# Patient Record
Sex: Male | Born: 1970 | Race: White | Hispanic: No | Marital: Married | Smoking: Former smoker
Health system: Southern US, Community
[De-identification: ages and names within clinical notes are randomized; demographics above are authoritative.]

## PROBLEM LIST (undated history)

## (undated) DIAGNOSIS — I10 Essential (primary) hypertension: Secondary | ICD-10-CM

## (undated) DIAGNOSIS — E669 Obesity, unspecified: Secondary | ICD-10-CM

## (undated) DIAGNOSIS — F909 Attention-deficit hyperactivity disorder, unspecified type: Secondary | ICD-10-CM

## (undated) DIAGNOSIS — E079 Disorder of thyroid, unspecified: Secondary | ICD-10-CM

## (undated) DIAGNOSIS — E785 Hyperlipidemia, unspecified: Secondary | ICD-10-CM

## (undated) HISTORY — DX: Hyperlipidemia, unspecified: E78.5

## (undated) HISTORY — DX: Attention-deficit hyperactivity disorder, unspecified type: F90.9

## (undated) HISTORY — DX: Obesity, unspecified: E66.9

## (undated) HISTORY — DX: Disorder of thyroid, unspecified: E07.9

## (undated) HISTORY — DX: Essential (primary) hypertension: I10

## (undated) HISTORY — PX: ADENOIDECTOMY: SUR15

---

## 2005-09-13 ENCOUNTER — Encounter: Admission: RE | Admit: 2005-09-13 | Discharge: 2005-09-13 | Payer: Self-pay | Admitting: Emergency Medicine

## 2006-01-01 ENCOUNTER — Emergency Department (HOSPITAL_COMMUNITY): Admission: EM | Admit: 2006-01-01 | Discharge: 2006-01-02 | Payer: Self-pay | Admitting: *Deleted

## 2007-03-01 IMAGING — CT CT HEAD W/O CM
1 series · 16 of 30 positions shown, 20 images · IV contrast (agent unspecified)
Comparison: none

CLINICAL DATA: Severe headache.  
 HEAD CT WITHOUT CONTRAST:
TECHNIQUE: Contiguous axial images were obtained from the base of the skull through the vertex according to standard protocol without contrast.

[Series 2: brain · axial · 0.47mm/px · z∈[+120,+262]mm · 16 of 32 slices shown, 20 images]
[im 2/32  brain]
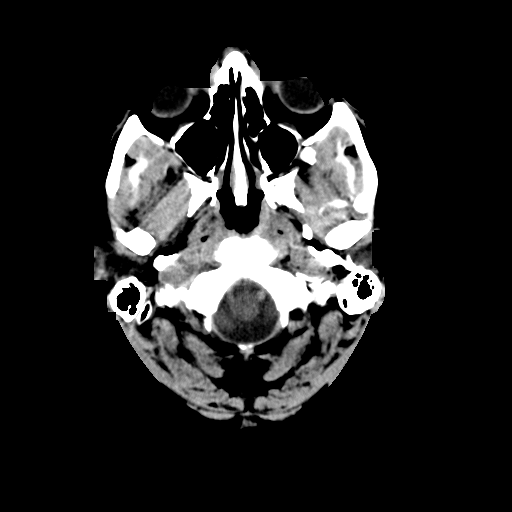
[im 2/32  bone]
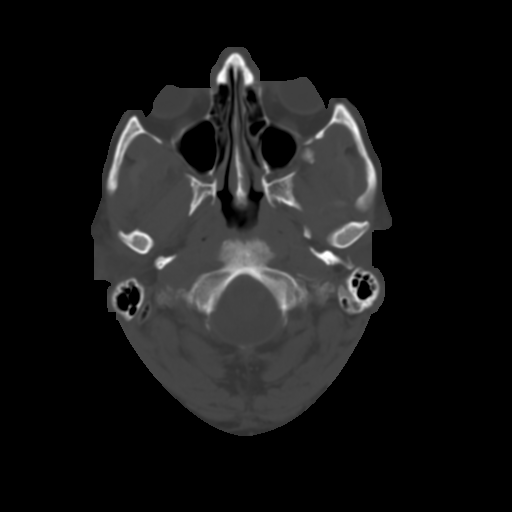
[im 4/32  brain]
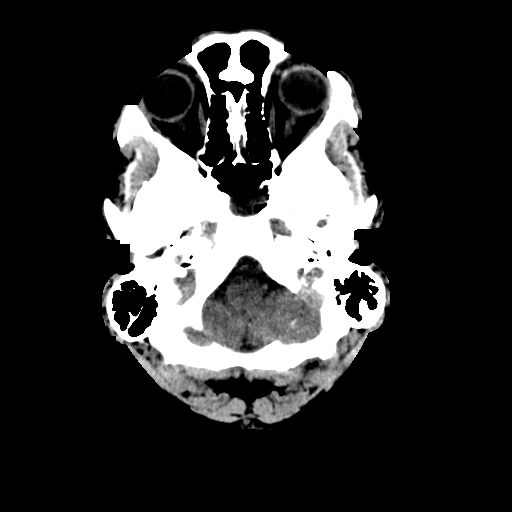
[im 6/32  brain]
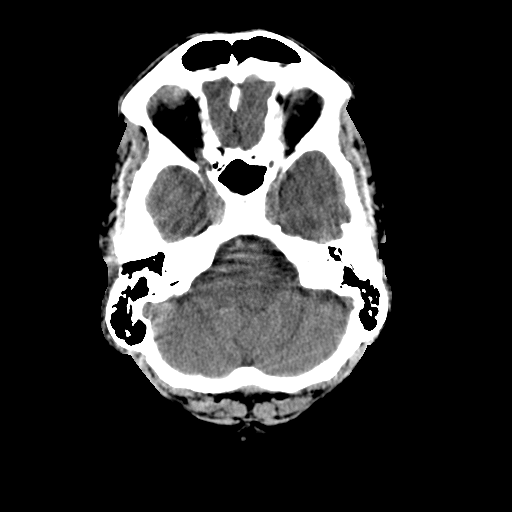
[im 8/32  brain]
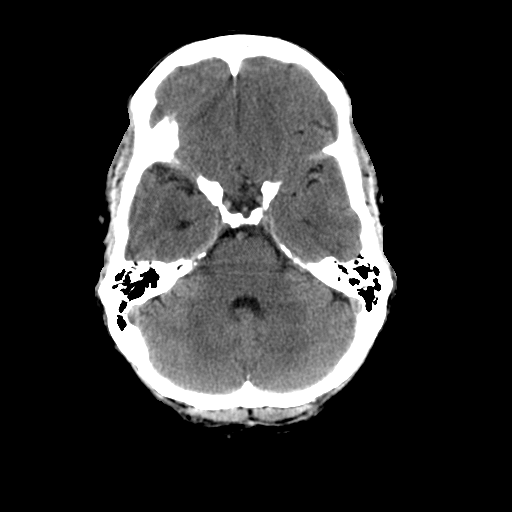
[im 9/32  brain]
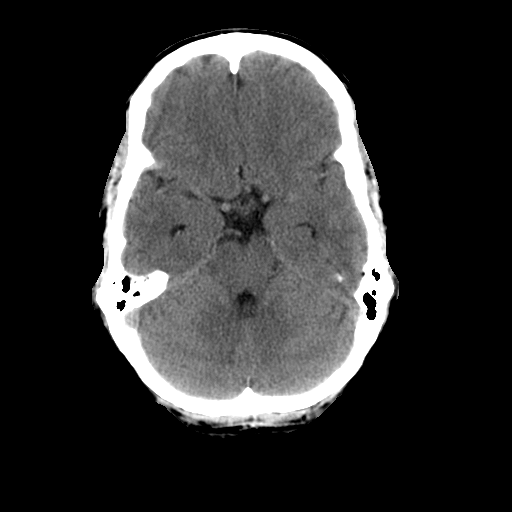
[im 9/32  bone]
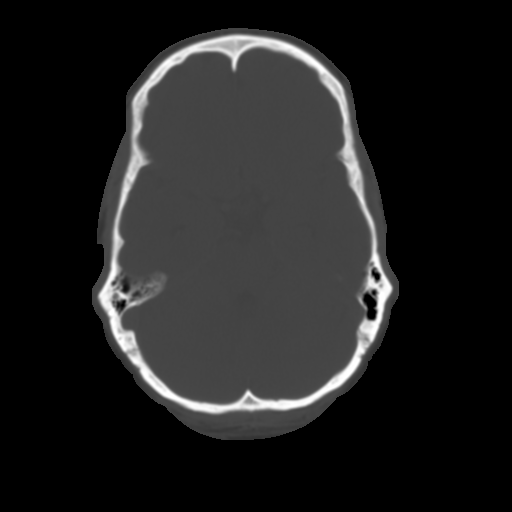
[im 11/32  brain]
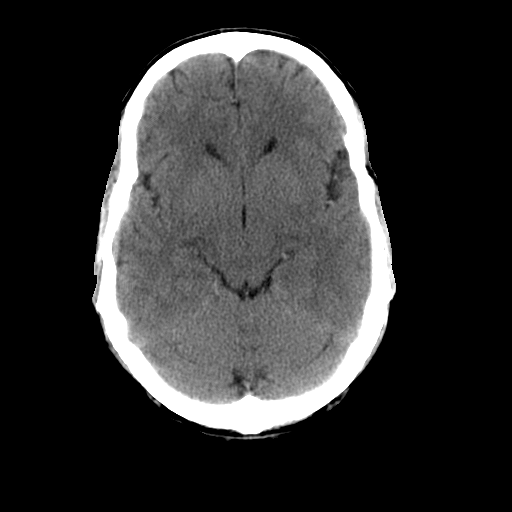
[im 13/32  brain]
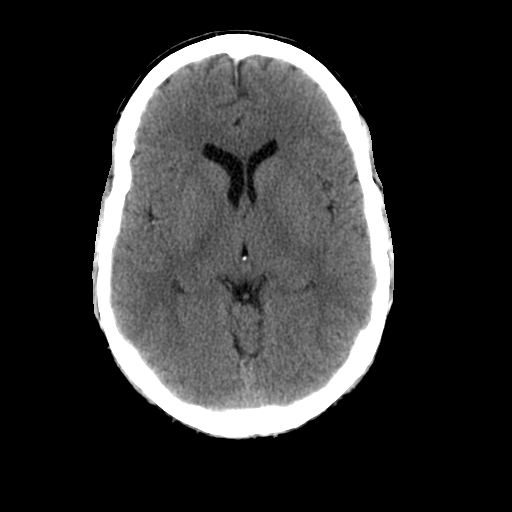
[im 15/32  brain]
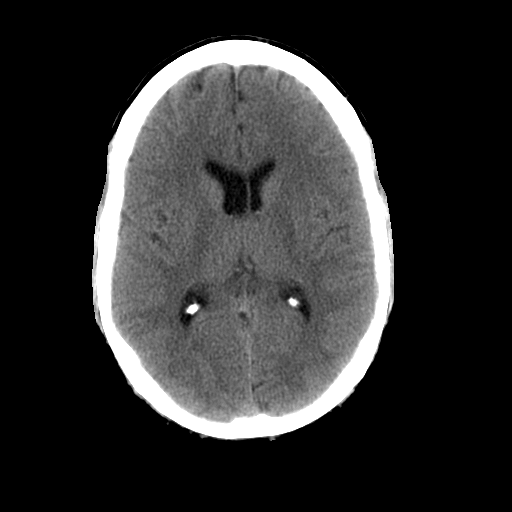
[im 17/32  brain]
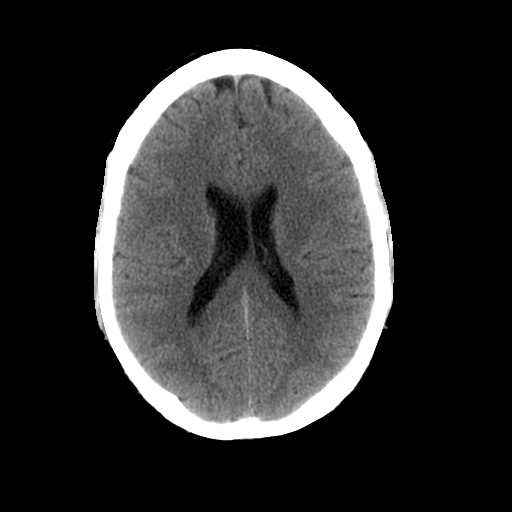
[im 17/32  bone]
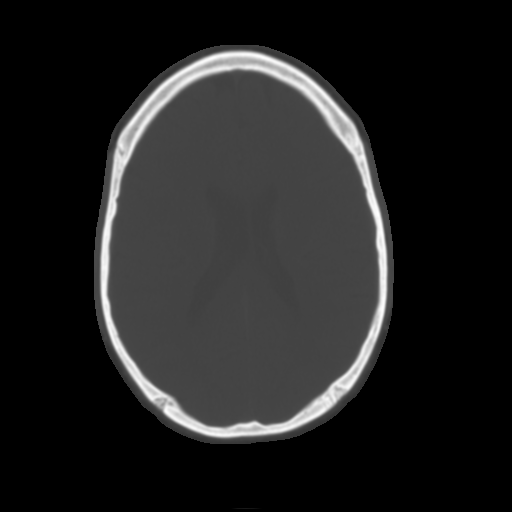
[im 19/32  brain]
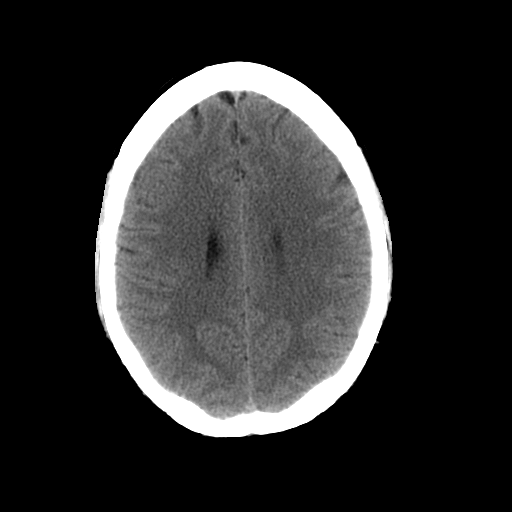
[im 21/32  brain]
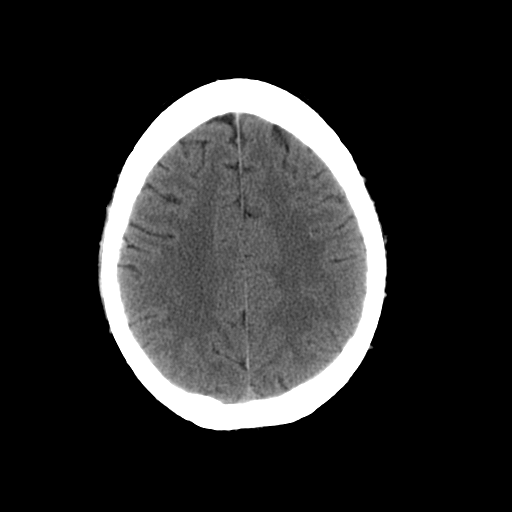
[im 23/32  brain]
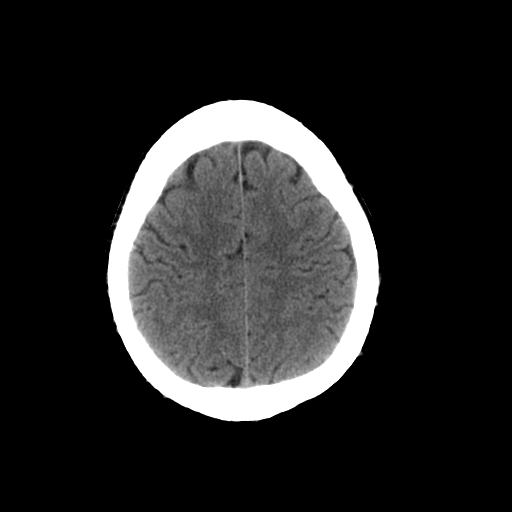
[im 24/32  brain]
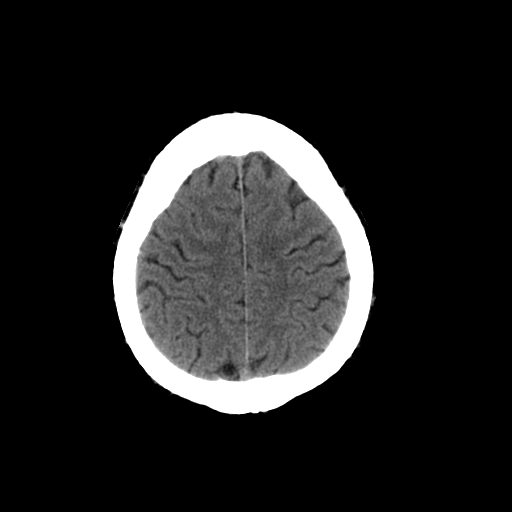
[im 24/32  bone]
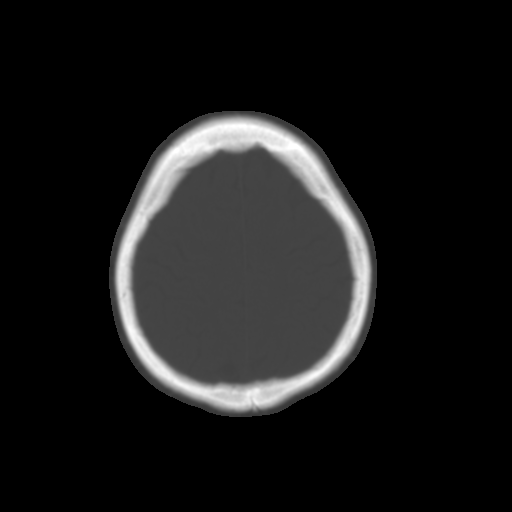
[im 26/32  brain]
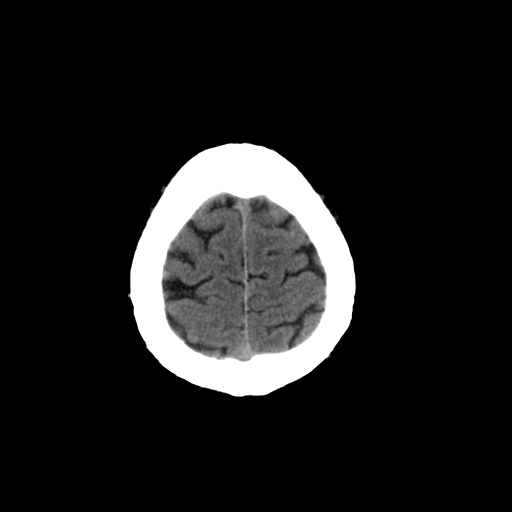
[im 28/32  brain]
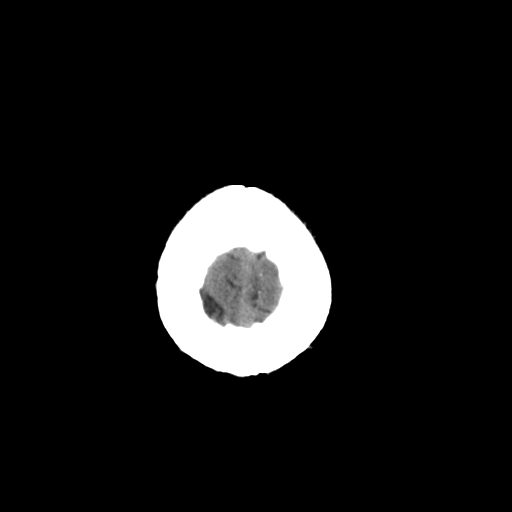
[im 30/32  brain]
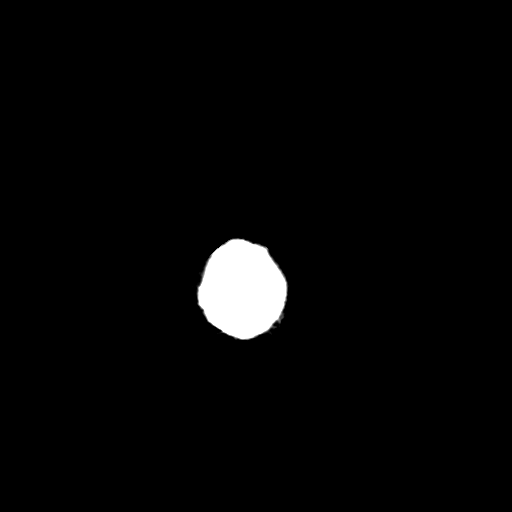

[16 of 30 positions shown; findings below may reference images not displayed]

FINDINGS: There is no evidence of intracranial hemorrhage, brain edema or other signs of acute infarct.  There is no evidence of intracranial mass or mass effect.  No extraaxial abnormality is identified.  The ventricles are normal in size.  No skull abnormality identified.  
 Soft tissue opacity is seen within the sphenoid sinus, which is new since previous study on 09/13/05.
IMPRESSION: 1.  No evidence of intracranial abnormality.  
 2.  Sphenoid sinusitis which is new since prior study on 09/13/05.

## 2011-06-18 ENCOUNTER — Ambulatory Visit: Payer: 59

## 2011-06-18 ENCOUNTER — Ambulatory Visit: Payer: 59 | Admitting: Family Medicine

## 2011-06-18 VITALS — BP 124/85 | HR 83 | Temp 98.2°F | Resp 16 | Ht 71.0 in | Wt 192.0 lb

## 2011-06-18 DIAGNOSIS — M25552 Pain in left hip: Secondary | ICD-10-CM

## 2011-06-18 DIAGNOSIS — M25559 Pain in unspecified hip: Secondary | ICD-10-CM

## 2011-06-18 LAB — POCT CBC
Granulocyte percent: 77.1 %G (ref 37–80)
HCT, POC: 47.7 % (ref 43.5–53.7)
Hemoglobin: 15.8 g/dL (ref 14.1–18.1)
Lymph, poc: 2.2 (ref 0.6–3.4)
MCH, POC: 29.6 pg (ref 27–31.2)
MCHC: 33.1 g/dL (ref 31.8–35.4)
MCV: 89.5 fL (ref 80–97)
MID (cbc): 0.6 (ref 0–0.9)
MPV: 7.9 fL (ref 0–99.8)
POC Granulocyte: 9.5 — AB (ref 2–6.9)
POC LYMPH PERCENT: 17.9 %L (ref 10–50)
POC MID %: 5 %M (ref 0–12)
Platelet Count, POC: 313 10*3/uL (ref 142–424)
RBC: 5.33 M/uL (ref 4.69–6.13)
RDW, POC: 13.4 %
WBC: 12.3 10*3/uL — AB (ref 4.6–10.2)

## 2011-06-18 LAB — POCT SEDIMENTATION RATE: POCT SED RATE: 19 mm/hr (ref 0–22)

## 2011-06-18 MED ORDER — HYDROCODONE-ACETAMINOPHEN 5-500 MG PO TABS: 1.0000 | ORAL_TABLET | Freq: Three times a day (TID) | ORAL | Status: AC | PRN

## 2011-06-18 NOTE — Progress Notes (Signed)
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 He's complaining about left hip pain has been going on for such a spontaneous without any injury. Never had this before. He describes the pain as severe and constant. Does not know whether he is weightbearing or not. The pain is made worse by moving in any direction.  Objective: Patient appears in mild discomfort when lying still, but in much greater pain when moving.  Range of motion of the hip elicits extreme pain. He has point tenderness over the trochanter.  His full range of motion of left hip..  Abdomen negative, CVA is nontender, SI joint nontender, skin intact, warm and dry.  [facility] reading (PRIMARY) by  Dr. Milus Glazier:  Neg hip  Results for orders placed in visit on 06/18/11  POCT CBC      Component Value Range   WBC 12.3 (*) 4.6 - 10.2 (K/uL)   Lymph, poc 2.2  0.6 - 3.4    POC LYMPH PERCENT 17.9  10 - 50 (%L)   MID (cbc) 0.6  0 - 0.9    POC MID % 5.0  0 - 12 (%M)   POC Granulocyte 9.5 (*) 2 - 6.9    Granulocyte percent 77.1  37 - 80 (%G)   RBC 5.33  4.69 - 6.13 (M/uL)   Hemoglobin 15.8  14.1 - 18.1 (g/dL)   HCT, POC 16.1  09.6 - 53.7 (%)   MCV 89.5  80 - 97 (fL)   MCH, POC 29.6  27 - 31.2 (pg)   MCHC 33.1  31.8 - 35.4 (g/dL)   RDW, POC 04.5     Platelet Count, POC 313  142 - 424 (K/uL)   MPV 7.9  0 - 99.8 (fL)  .

## 2012-08-15 IMAGING — CR DG HIP (WITH OR WITHOUT PELVIS) 2-3V*L*
2 series · 2 of 2 positions shown · non-contrast
Comparison: None.

CLINICAL DATA: Left hip pain.  No reported injury.

LEFT HIP - COMPLETE 2+ VIEW

[AP]
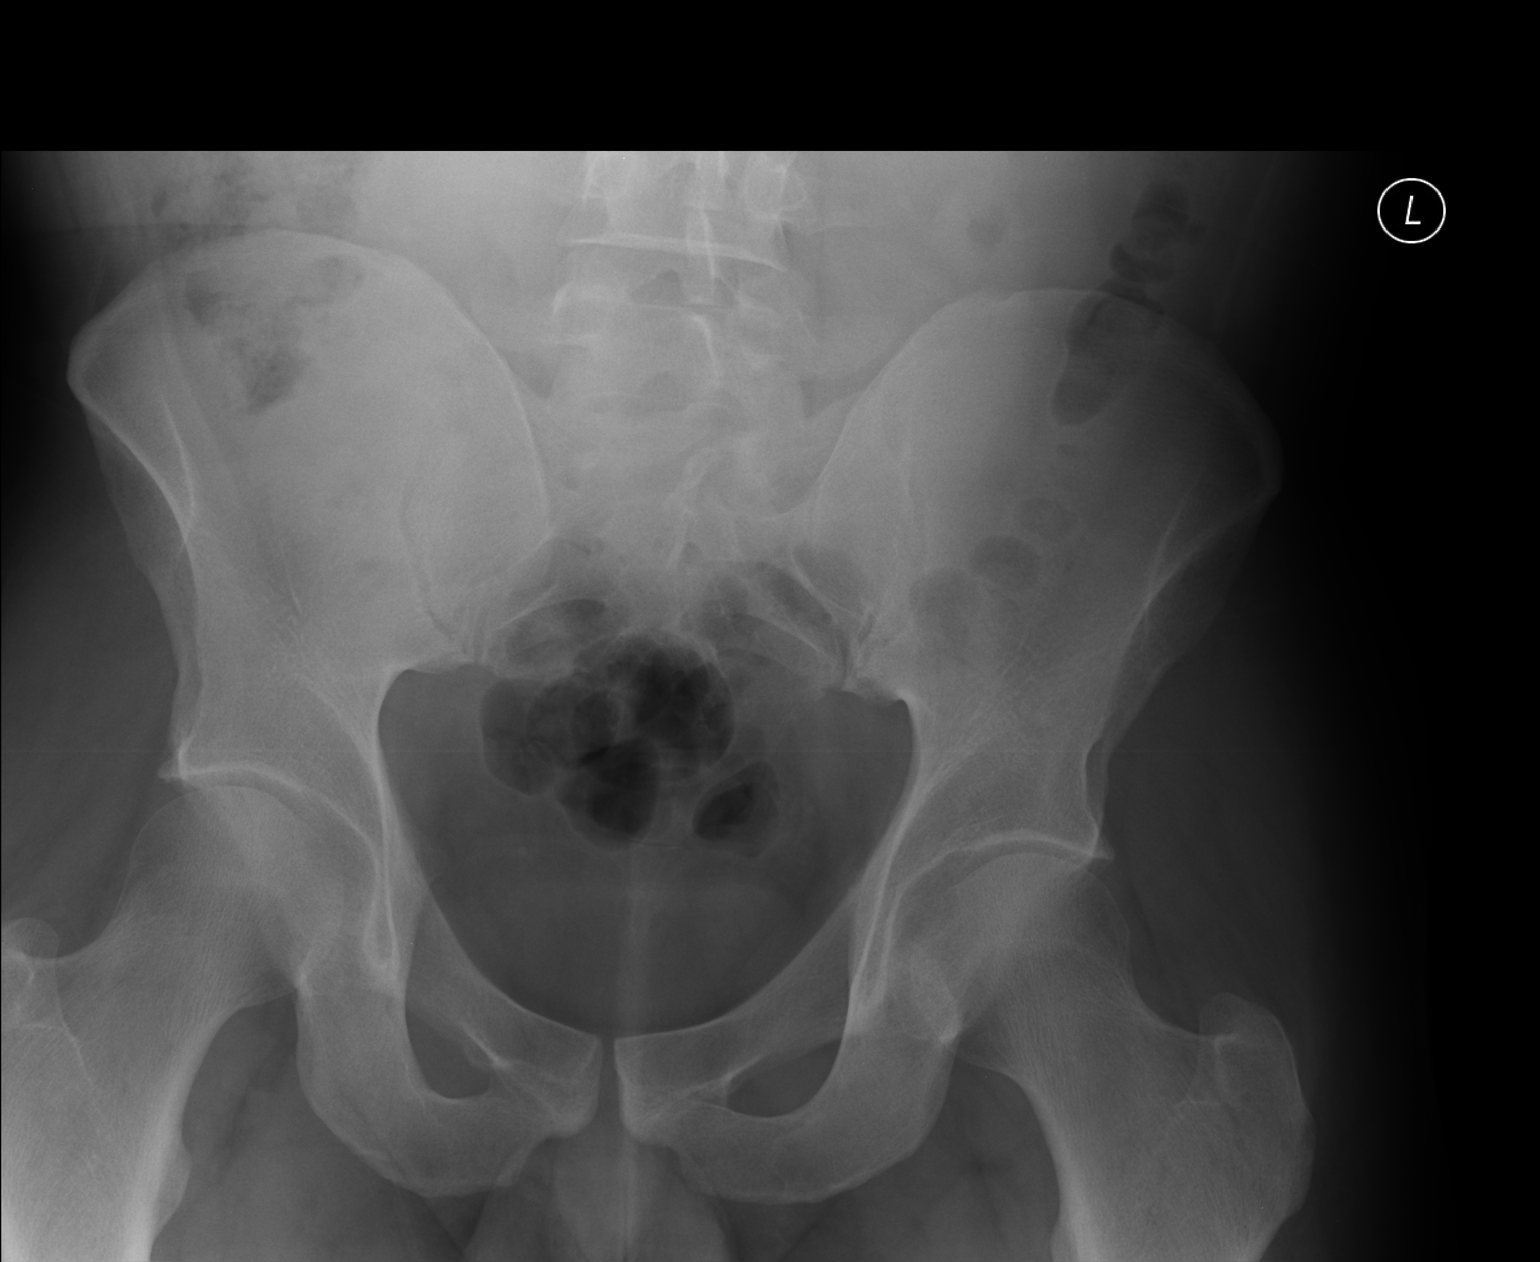

[lateral]
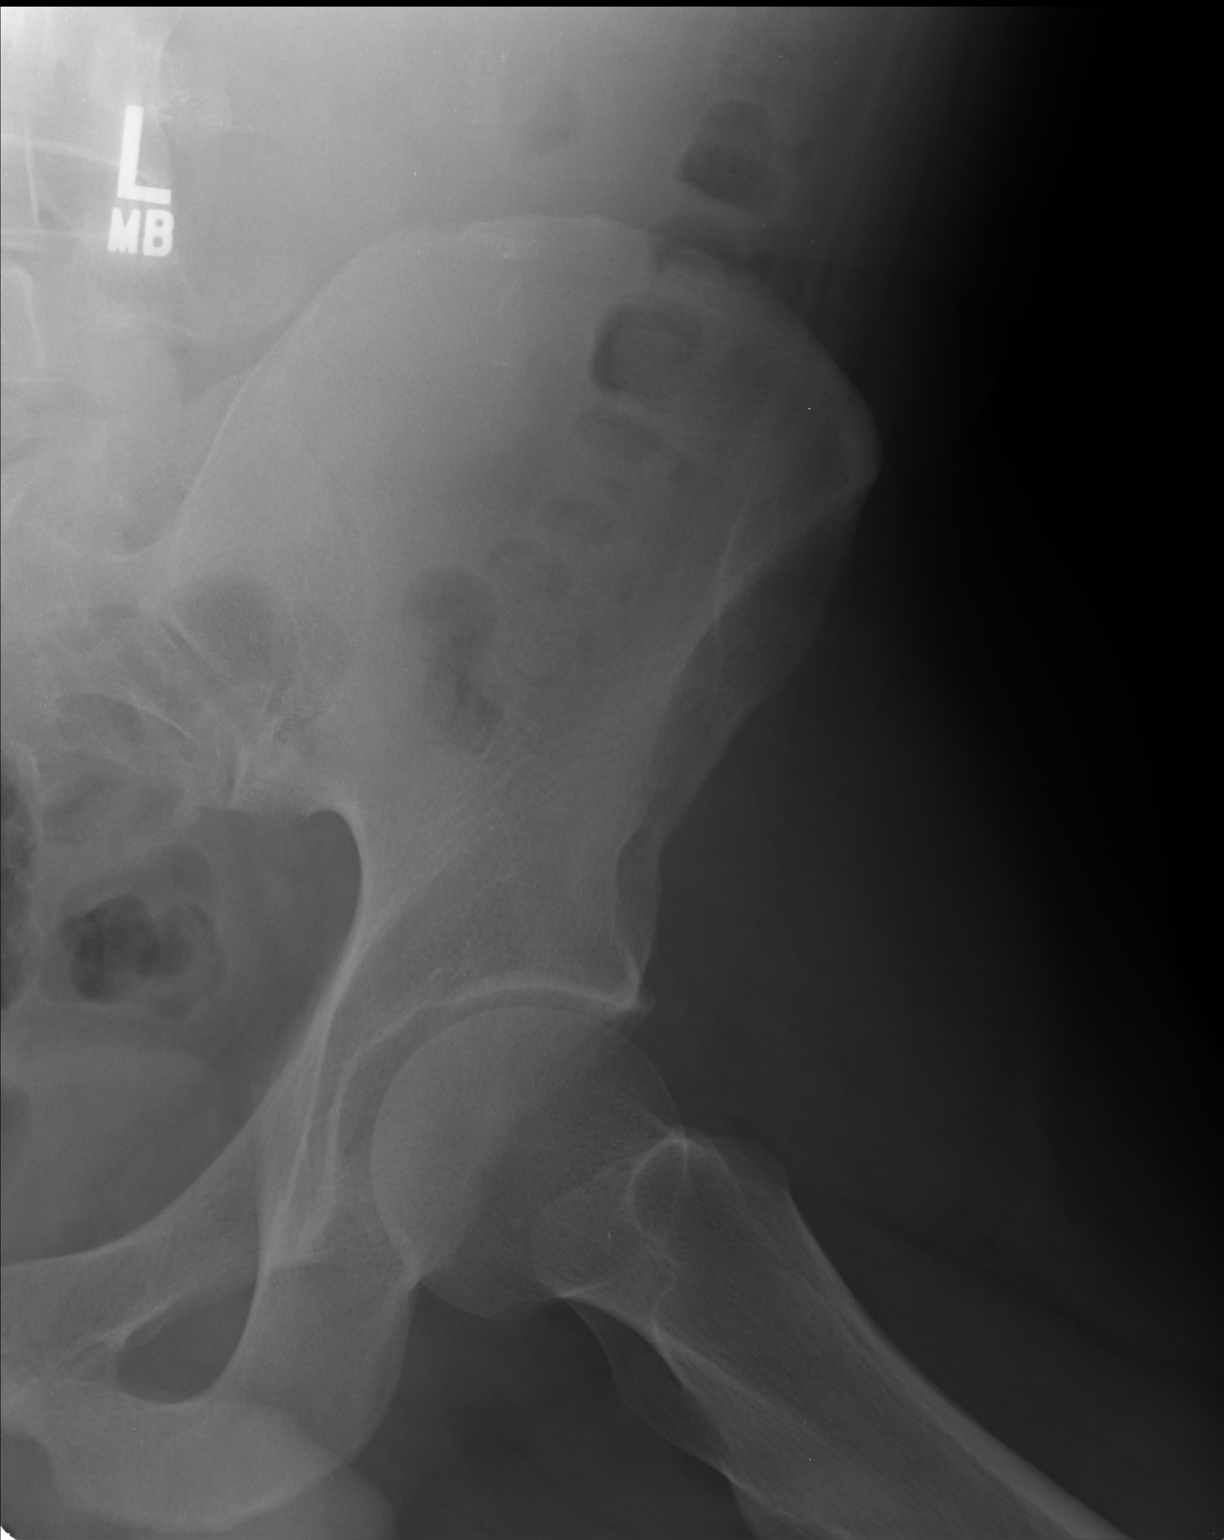

[2 of 2 positions shown; findings below may reference images not displayed]

FINDINGS: Normal appearing pelvic bones, left hip and lower lumbar
spine.  No soft tissue abnormalities.
IMPRESSION: Normal examination.

## 2014-09-20 ENCOUNTER — Other Ambulatory Visit: Payer: Self-pay | Admitting: Orthopedic Surgery

## 2014-09-20 ENCOUNTER — Encounter (HOSPITAL_BASED_OUTPATIENT_CLINIC_OR_DEPARTMENT_OTHER): Payer: Self-pay | Admitting: *Deleted

## 2014-09-21 NOTE — H&P (Signed)
Antonio Wiggins is an 44 y.o. male.   CC / Reason for Visit: Right ring finger mass HPI: This patient is a 44-year-old RHD male who presents for evaluation of his right ring finger.  He reports that he has had a slowly enlarging mass develop on the volar aspect, slightly ulnar to the midline.  It isn't painful unless it is pressed upon.  It is becoming increasingly bothersome.  History reviewed. No pertinent past medical history.  Past Surgical History  Procedure Laterality Date  . Adenoidectomy      History reviewed. No pertinent family history. Social History:  reports that he has quit smoking. His smoking use included Pipe. He does not have any smokeless tobacco history on file. He reports that he does not drink alcohol or use illicit drugs.  Allergies: No Known Allergies  No prescriptions prior to admission    No results found for this or any previous visit (from the past 48 hour(s)). No results found.  Review of Systems  All other systems reviewed and are negative.   Height 5\' 11"  (1.803 m), weight 86.183 kg (190 lb). Physical Exam  Constitutional:  WD, WN, NAD HEENT:  NCAT, EOMI Neuro/Psych:  Alert & oriented to person, place, and time; appropriate mood & affect Lymphatic: No generalized UE edema or lymphadenopathy Extremities / MSK:  Both UE are normal with respect to appearance, ranges of motion, joint stability, muscle strength/tone, sensation, & perfusion except as otherwise noted:  Right ring finger has full flexion-extension.  There is a mass that seems deep to the skin overlying the proximal phalanx in the midportion, ulnar than midline.  It does not seem to be the typical consistency of a retinacular cyst or a fibroma.  It does appear to be mobile.  Labs / Xrays: No radiographic studies obtained today.  Assessment:  Right ring finger soft tissue mass  Plan:  I discussed different options with him for proceeding, to include additional imaging versus  excisional biopsy.  He thinks he would like to proceed with excisional biopsy. We will arrange for this with him.    The details of the operative procedure were discussed with the patient.  Questions were invited and answered.  In addition to the goal of the procedure, the risks of the procedure to include but not limited to bleeding; infection; damage to the nerves or blood vessels that could result in bleeding, numbness, weakness, chronic pain, and the need for additional procedures; stiffness; the need for revision surgery; and anesthetic risks, were reviewed.  No specific outcome was guaranteed or implied.  Informed consent was obtained.   Antonio Wiggins A. 09/21/2014, 2:54 PM

## 2014-09-26 ENCOUNTER — Ambulatory Visit (HOSPITAL_BASED_OUTPATIENT_CLINIC_OR_DEPARTMENT_OTHER): Payer: 59 | Admitting: Certified Registered"

## 2014-09-26 ENCOUNTER — Encounter (HOSPITAL_BASED_OUTPATIENT_CLINIC_OR_DEPARTMENT_OTHER)
Admission: RE | Disposition: A | Discharge status: Home / Self Care | Payer: Self-pay | Source: Ambulatory Visit | Attending: Orthopedic Surgery

## 2014-09-26 ENCOUNTER — Ambulatory Visit (HOSPITAL_BASED_OUTPATIENT_CLINIC_OR_DEPARTMENT_OTHER)
Admission: RE | Admit: 2014-09-26 | Discharge: 2014-09-26 | Disposition: A | Discharge status: Home / Self Care | Payer: 59 | Source: Ambulatory Visit | Attending: Orthopedic Surgery | Admitting: Orthopedic Surgery

## 2014-09-26 ENCOUNTER — Encounter (HOSPITAL_BASED_OUTPATIENT_CLINIC_OR_DEPARTMENT_OTHER): Payer: Self-pay | Admitting: *Deleted

## 2014-09-26 DIAGNOSIS — D2111 Benign neoplasm of connective and other soft tissue of right upper limb, including shoulder: Secondary | ICD-10-CM | POA: Insufficient documentation

## 2014-09-26 DIAGNOSIS — Z87891 Personal history of nicotine dependence: Secondary | ICD-10-CM | POA: Insufficient documentation

## 2014-09-26 DIAGNOSIS — R2231 Localized swelling, mass and lump, right upper limb: Secondary | ICD-10-CM | POA: Diagnosis present

## 2014-09-26 HISTORY — PX: MASS EXCISION: SHX2000

## 2014-09-26 LAB — POCT HEMOGLOBIN-HEMACUE: Hemoglobin: 15.4 g/dL (ref 13.0–17.0)

## 2014-09-26 SURGERY — EXCISION MASS
Anesthesia: Monitor Anesthesia Care | Site: Finger | Laterality: Right

## 2014-09-26 MED ORDER — CEFAZOLIN SODIUM-DEXTROSE 2-3 GM-% IV SOLR
2.0000 g | INTRAVENOUS | Status: AC
  Administered 2014-09-26: 2 g via INTRAVENOUS

## 2014-09-26 MED ORDER — FENTANYL CITRATE (PF) 100 MCG/2ML IJ SOLN
INTRAMUSCULAR | Status: DC | PRN
  Administered 2014-09-26: 100 ug via INTRAVENOUS

## 2014-09-26 MED ORDER — PROMETHAZINE HCL 25 MG/ML IJ SOLN
6.2500 mg | INTRAMUSCULAR | Status: DC | PRN
Start: 2014-09-26 — End: 2014-09-26

## 2014-09-26 MED ORDER — PROPOFOL INFUSION 10 MG/ML OPTIME
INTRAVENOUS | Status: DC | PRN
  Administered 2014-09-26: 100 ug/kg/min via INTRAVENOUS

## 2014-09-26 MED ORDER — ONDANSETRON HCL 4 MG/2ML IJ SOLN
INTRAMUSCULAR | Status: DC | PRN
  Administered 2014-09-26: 4 mg via INTRAVENOUS

## 2014-09-26 MED ORDER — MIDAZOLAM HCL 2 MG/2ML IJ SOLN
INTRAMUSCULAR | Status: AC
  Filled 2014-09-26: qty 2

## 2014-09-26 MED ORDER — MEPERIDINE HCL 25 MG/ML IJ SOLN: 6.2500 mg | INTRAMUSCULAR | Status: DC | PRN

## 2014-09-26 MED ORDER — HYDROCODONE-ACETAMINOPHEN 5-325 MG PO TABS: 1.0000 | ORAL_TABLET | Freq: Four times a day (QID) | ORAL | Status: AC | PRN

## 2014-09-26 MED ORDER — FENTANYL CITRATE (PF) 100 MCG/2ML IJ SOLN: 50.0000 ug | INTRAMUSCULAR | Status: DC | PRN

## 2014-09-26 MED ORDER — PROPOFOL 500 MG/50ML IV EMUL
INTRAVENOUS | Status: AC
  Filled 2014-09-26: qty 50

## 2014-09-26 MED ORDER — BUPIVACAINE-EPINEPHRINE (PF) 0.5% -1:200000 IJ SOLN
INTRAMUSCULAR | Status: AC
  Filled 2014-09-26: qty 30

## 2014-09-26 MED ORDER — BUPIVACAINE-EPINEPHRINE 0.5% -1:200000 IJ SOLN
INTRAMUSCULAR | Status: DC | PRN
  Administered 2014-09-26: 5 mL

## 2014-09-26 MED ORDER — LIDOCAINE HCL 2 % IJ SOLN
INTRAMUSCULAR | Status: AC
Start: 2014-09-26 — End: 2014-09-26
  Filled 2014-09-26: qty 20

## 2014-09-26 MED ORDER — GLYCOPYRROLATE 0.2 MG/ML IJ SOLN: 0.2000 mg | Freq: Once | INTRAMUSCULAR | Status: DC | PRN

## 2014-09-26 MED ORDER — HYDROMORPHONE HCL 1 MG/ML IJ SOLN: 0.2500 mg | INTRAMUSCULAR | Status: DC | PRN

## 2014-09-26 MED ORDER — LIDOCAINE HCL 2 % IJ SOLN
INTRAMUSCULAR | Status: DC | PRN
  Administered 2014-09-26: 5 mL

## 2014-09-26 MED ORDER — OXYCODONE HCL 5 MG/5ML PO SOLN: 5.0000 mg | Freq: Once | ORAL | Status: DC | PRN

## 2014-09-26 MED ORDER — PROPOFOL INFUSION 10 MG/ML OPTIME
INTRAVENOUS | Status: DC | PRN
  Administered 2014-09-26: 30 mL via INTRAVENOUS

## 2014-09-26 MED ORDER — KETOROLAC TROMETHAMINE 30 MG/ML IJ SOLN: 30.0000 mg | Freq: Once | INTRAMUSCULAR | Status: DC | PRN

## 2014-09-26 MED ORDER — LACTATED RINGERS IV SOLN
INTRAVENOUS | Status: DC
  Administered 2014-09-26 (×2): via INTRAVENOUS

## 2014-09-26 MED ORDER — LACTATED RINGERS IV SOLN: INTRAVENOUS | Status: DC

## 2014-09-26 MED ORDER — MIDAZOLAM HCL 2 MG/2ML IJ SOLN
1.0000 mg | INTRAMUSCULAR | Status: DC | PRN
  Administered 2014-09-26: 2 mg via INTRAVENOUS

## 2014-09-26 MED ORDER — FENTANYL CITRATE (PF) 100 MCG/2ML IJ SOLN
INTRAMUSCULAR | Status: AC
  Filled 2014-09-26: qty 4

## 2014-09-26 MED ORDER — CEFAZOLIN SODIUM-DEXTROSE 2-3 GM-% IV SOLR
INTRAVENOUS | Status: AC
  Filled 2014-09-26: qty 50

## 2014-09-26 MED ORDER — OXYCODONE HCL 5 MG PO TABS: 5.0000 mg | ORAL_TABLET | Freq: Once | ORAL | Status: DC | PRN

## 2014-09-26 SURGICAL SUPPLY — 43 items
BANDAGE COBAN STERILE 2 (GAUZE/BANDAGES/DRESSINGS) IMPLANT
BLADE MINI RND TIP GREEN BEAV (BLADE) IMPLANT
BLADE SURG 15 STRL LF DISP TIS (BLADE) ×1 IMPLANT
BLADE SURG 15 STRL SS (BLADE) ×2
BNDG COHESIVE 4X5 TAN STRL (GAUZE/BANDAGES/DRESSINGS) ×2 IMPLANT
BNDG ESMARK 4X9 LF (GAUZE/BANDAGES/DRESSINGS) IMPLANT
BNDG GAUZE 1X2.1 STRL (MISCELLANEOUS) IMPLANT
BNDG GAUZE ELAST 4 BULKY (GAUZE/BANDAGES/DRESSINGS) ×4 IMPLANT
CHLORAPREP W/TINT 26ML (MISCELLANEOUS) ×2 IMPLANT
CORDS BIPOLAR (ELECTRODE) ×2 IMPLANT
COVER BACK TABLE 60X90IN (DRAPES) ×2 IMPLANT
COVER MAYO STAND STRL (DRAPES) ×2 IMPLANT
CUFF TOURNIQUET SINGLE 18IN (TOURNIQUET CUFF) ×2 IMPLANT
DRAIN PENROSE 1/2X12 LTX STRL (WOUND CARE) IMPLANT
DRAPE EXTREMITY T 121X128X90 (DRAPE) ×2 IMPLANT
DRAPE SURG 17X23 STRL (DRAPES) ×2 IMPLANT
DRSG EMULSION OIL 3X3 NADH (GAUZE/BANDAGES/DRESSINGS) ×2 IMPLANT
GLOVE BIO SURGEON STRL SZ 6.5 (GLOVE) ×2 IMPLANT
GLOVE BIO SURGEON STRL SZ7.5 (GLOVE) ×2 IMPLANT
GLOVE BIOGEL PI IND STRL 7.0 (GLOVE) ×3 IMPLANT
GLOVE BIOGEL PI IND STRL 8 (GLOVE) ×1 IMPLANT
GLOVE BIOGEL PI INDICATOR 7.0 (GLOVE) ×3
GLOVE BIOGEL PI INDICATOR 8 (GLOVE) ×1
GLOVE ECLIPSE 6.5 STRL STRAW (GLOVE) ×2 IMPLANT
GOWN STRL REUS W/ TWL LRG LVL3 (GOWN DISPOSABLE) ×3 IMPLANT
GOWN STRL REUS W/TWL LRG LVL3 (GOWN DISPOSABLE) ×6
GOWN STRL REUS W/TWL XL LVL3 (GOWN DISPOSABLE) ×2 IMPLANT
NEEDLE HYPO 25X1 1.5 SAFETY (NEEDLE) ×2 IMPLANT
NS IRRIG 1000ML POUR BTL (IV SOLUTION) ×2 IMPLANT
PACK BASIN DAY SURGERY FS (CUSTOM PROCEDURE TRAY) ×2 IMPLANT
PADDING CAST ABS 4INX4YD NS (CAST SUPPLIES)
PADDING CAST ABS COTTON 4X4 ST (CAST SUPPLIES) IMPLANT
RUBBERBAND STERILE (MISCELLANEOUS) IMPLANT
SPONGE GAUZE 4X4 12PLY STER LF (GAUZE/BANDAGES/DRESSINGS) ×2 IMPLANT
STOCKINETTE 6  STRL (DRAPES) ×1
STOCKINETTE 6 STRL (DRAPES) ×1 IMPLANT
SUT VICRYL RAPIDE 4-0 (SUTURE) IMPLANT
SUT VICRYL RAPIDE 4/0 PS 2 (SUTURE) IMPLANT
SYR BULB 3OZ (MISCELLANEOUS) ×2 IMPLANT
SYRINGE 10CC LL (SYRINGE) ×2 IMPLANT
TOWEL OR 17X24 6PK STRL BLUE (TOWEL DISPOSABLE) ×2 IMPLANT
TOWEL OR NON WOVEN STRL DISP B (DISPOSABLE) ×2 IMPLANT
UNDERPAD 30X30 (UNDERPADS AND DIAPERS) ×2 IMPLANT

## 2014-09-26 NOTE — Discharge Instructions (Addendum)
Discharge Instructions ° ° °You have a light dressing on your hand.  °You may begin gentle motion of your fingers and hand immediately, but you should not do any heavy lifting or gripping.  Elevate your hand to reduce pain & swelling of the digits.  Ice over the operative site may be helpful to reduce pain & swelling.  DO NOT USE HEAT. °Pain medicine has been prescribed for you.  °Use your medicine as needed over the first 48 hours, and then you can begin to taper your use. You may use Tylenol in place of your prescribed pain medication, but not IN ADDITION to it. °Leave the dressing in place until the third day after your surgery and then remove it, leaving it open to air.  °After the bandage has been removed you may shower, regularly washing the incision and letting the water run over it, but not submerging it (no swimming, soaking it in dishwater, etc.) °You may drive a car when you are off of prescription pain medications and can safely control your vehicle with both hands. °We will address whether therapy will be required or not when you return to the office. °You may have already made your follow-up appointment when we completed your preop visit.  If not, please call our office today or the next business day to make your return appointment for 10-15 days after surgery. ° ° °Please call [phone] during normal business hours or [phone] after hours for any problems. Including the following: ° °- excessive redness of the incisions °- drainage for more than 4 days °- fever of more than 101.5 F ° °*Please note that pain medications will not be refilled after hours or on weekends. °Post Anesthesia Home Care Instructions ° °Activity: °Get plenty of rest for the remainder of the day. A responsible adult should stay with you for 24 hours following the procedure.  °For the next 24 hours, DO NOT: °-Drive a car °-Operate machinery °-Drink alcoholic beverages °-Take any medication unless instructed by your  physician °-Make any legal decisions or sign important papers. ° °Meals: °Start with liquid foods such as gelatin or soup. Progress to regular foods as tolerated. Avoid greasy, spicy, heavy foods. If nausea and/or vomiting occur, drink only clear liquids until the nausea and/or vomiting subsides. Call your physician if vomiting continues. ° °Special Instructions/Symptoms: °Your throat may feel dry or sore from the anesthesia or the breathing tube placed in your throat during surgery. If this causes discomfort, gargle with warm salt water. The discomfort should disappear within 24 hours. ° °If you had a scopolamine patch placed behind your ear for the management of post- operative nausea and/or vomiting: ° °1. The medication in the patch is effective for 72 hours, after which it should be removed.  Wrap patch in a tissue and discard in the trash. Wash hands thoroughly with soap and water. °2. You may remove the patch earlier than 72 hours if you experience unpleasant side effects which may include dry mouth, dizziness or visual disturbances. °3. Avoid touching the patch. Wash your hands with soap and water after contact with the patch. °  ° °

## 2014-09-26 NOTE — Anesthesia Preprocedure Evaluation (Addendum)
Anesthesia Evaluation  Patient identified by MRN, date of birth, ID band Patient awake    Reviewed: Allergy & Precautions, NPO status , Patient's Chart, lab work & pertinent test results  Airway Mallampati: II  TM Distance: >3 FB Neck ROM: Full    Dental no notable dental hx.    Pulmonary former smoker,  breath sounds clear to auscultation  Pulmonary exam normal       Cardiovascular negative cardio ROS Normal cardiovascular examRhythm:Regular Rate:Normal     Neuro/Psych negative neurological ROS  negative psych ROS   GI/Hepatic negative GI ROS, Neg liver ROS,   Endo/Other  negative endocrine ROS  Renal/GU negative Renal ROS     Musculoskeletal negative musculoskeletal ROS (+)   Abdominal   Peds  Hematology negative hematology ROS (+)   Anesthesia Other Findings   Reproductive/Obstetrics                            Anesthesia Physical Anesthesia Plan  ASA: I  Anesthesia Plan: MAC   Post-op Pain Management:    Induction: Intravenous  Airway Management Planned: Simple Face Mask and Natural Airway  Additional Equipment:   Intra-op Plan:   Post-operative Plan:   Informed Consent: I have reviewed the patients History and Physical, chart, labs and discussed the procedure including the risks, benefits and alternatives for the proposed anesthesia with the patient or authorized representative who has indicated his/her understanding and acceptance.   Dental advisory given  Plan Discussed with: CRNA  Anesthesia Plan Comments:         Anesthesia Quick Evaluation

## 2014-09-26 NOTE — Interval H&P Note (Signed)
History and Physical Interval Note:  09/26/2014 12:50 PM  Antonio Wiggins. [name]  has presented today for surgery, with the diagnosis of right ring finger mass  The various methods of treatment have been discussed with the patient and family. After consideration of risks, benefits and other options for treatment, the patient has consented to  Procedure(s): REMOVAL OF RIGHT RING FINGER MASS (Right) as a surgical intervention .  The patient's history has been reviewed, patient examined, no change in status, stable for surgery.  I have reviewed the patient's chart and labs.  Questions were answered to the patient's satisfaction.     Shilah Hefel A.

## 2014-09-26 NOTE — Anesthesia Procedure Notes (Signed)
Procedure Name: MAC Date/Time: 09/26/2014 12:55 PM Performed by: Baxter Flattery Pre-anesthesia Checklist: Patient identified, Emergency Drugs available, Suction available, Patient being monitored and Timeout performed Patient Re-evaluated:Patient Re-evaluated prior to inductionOxygen Delivery Method: Simple face mask Preoxygenation: Pre-oxygenation with 100% oxygen Intubation Type: IV induction Dental Injury: Teeth and Oropharynx as per pre-operative assessment

## 2014-09-26 NOTE — Transfer of Care (Signed)
Immediate Anesthesia Transfer of Care Note  Patient: Antonio Wiggins. Kings  Procedure(s) Performed: Procedure(s): REMOVAL OF RIGHT RING FINGER MASS (Right)  Patient Location: PACU  Anesthesia Type:MAC  Level of Consciousness: awake, alert  and oriented  Airway & Oxygen Therapy: Patient Spontanous Breathing and Patient connected to face mask oxygen  Post-op Assessment: Report given to RN, Post -op Vital signs reviewed and stable and Patient moving all extremities  Post vital signs: Reviewed and stable  Last Vitals:  Filed Vitals:   09/26/14 1115  BP: 148/83  Pulse: 99  Temp: 36.8 C  Resp: 20    Complications: No apparent anesthesia complications

## 2014-09-26 NOTE — Op Note (Signed)
09/26/2014  12:50 PM  PATIENT:  Antonio Wiggins  44 y.o. male  PRE-OPERATIVE DIAGNOSIS:  R RF mass  POST-OPERATIVE DIAGNOSIS:  Same  PROCEDURE:  Excision of R RF mass  SURGEON: Rayvon Char. Grandville Silos, MD  PHYSICIAN ASSISTANT: Morley Kos, OPA-C  ANESTHESIA:  local and MAC  SPECIMENS:  To pathology  DRAINS:   None  EBL:  less than 50 mL  PREOPERATIVE INDICATIONS:  Antonio Wiggins is a  44 y.o. male with enlarging right ring finger mass over the volar aspect of the proximal phalanx  The risks benefits and alternatives were discussed with the patient preoperatively including but not limited to the risks of infection, bleeding, nerve injury, cardiopulmonary complications, the need for revision surgery, among others, and the patient verbalized understanding and consented to proceed.  OPERATIVE IMPLANTS: None  OPERATIVE PROCEDURE:  After receiving prophylactic antibiotics, the patient was escorted to the operative theatre and placed in a supine position.  A surgical "time-out" was performed during which the planned procedure, proposed operative site, and the correct patient identity were compared to the operative consent and agreement confirmed by the circulating nurse according to current facility policy. I performed a digital block slightly more proximal than typical in order to provide appropriate anesthesia for the region of interest. This was done with a mixture of lidocaine and Marcaine bearing epinephrine. Following application of a tourniquet to the operative extremity, the exposed skin was prepped with Chloraprep and draped in the usual sterile fashion.  The limb was exsanguinated with an Esmarch bandage and the tourniquet inflated to approximately 142mmHg higher than systolic BP.  An oblique incision was made sharply over the mass area there was a small punctate skin lesion which was elliptically excised as well. Spreading dissection was carried into the deeper tissues  and a multilobular dark purplish reddish mass was encountered. It did not appear to have the digital artery feeding it. The digital nerve on the ulnar and radial sides were identified through dissection and found to be removed from the mass. The mass was then marginally excised. It was slightly encapsulated, but not densely so. Tourniquet was released the wound irrigated and some additional hemostasis obtained with bipolar cautery. Skin was closed with 4-0 Vicryls Rapide interrupted sutures and a digital dressing was applied. He was taken to the recovery room in stable condition.  DISPOSITION: He will be discharged home today returning in 10-15 days for reevaluation and review of pathology.

## 2014-09-26 NOTE — Anesthesia Postprocedure Evaluation (Signed)
Anesthesia Post Note  Patient: Antonio Wiggins. [place]  Procedure(s) Performed: Procedure(s) (LRB): REMOVAL OF RIGHT RING FINGER MASS (Right)  Anesthesia type: MAC  Patient location: PACU  Post pain: Pain level controlled  Post assessment: Post-op Vital signs reviewed  Last Vitals: BP 146/80 mmHg  Pulse 77  Temp(Src) 36.7 C (Oral)  Resp 16  Ht 5\' 11"  (1.803 m)  Wt 188 lb (85.276 kg)  BMI 26.23 kg/m2  SpO2 100%  Post vital signs: Reviewed  Level of consciousness: awake  Complications: No apparent anesthesia complications

## 2014-09-27 ENCOUNTER — Encounter (HOSPITAL_BASED_OUTPATIENT_CLINIC_OR_DEPARTMENT_OTHER): Payer: Self-pay | Admitting: Orthopedic Surgery

## 2015-12-04 ENCOUNTER — Ambulatory Visit (INDEPENDENT_AMBULATORY_CARE_PROVIDER_SITE_OTHER): Payer: 59 | Admitting: Physician Assistant

## 2015-12-04 VITALS — BP 144/88 | HR 78 | Temp 98.6°F | Resp 17 | Ht 71.0 in | Wt 199.0 lb

## 2015-12-04 DIAGNOSIS — I868 Varicose veins of other specified sites: Secondary | ICD-10-CM

## 2015-12-04 DIAGNOSIS — IMO0001 Reserved for inherently not codable concepts without codable children: Secondary | ICD-10-CM

## 2015-12-04 DIAGNOSIS — I8390 Asymptomatic varicose veins of unspecified lower extremity: Secondary | ICD-10-CM

## 2015-12-04 DIAGNOSIS — R030 Elevated blood-pressure reading, without diagnosis of hypertension: Secondary | ICD-10-CM | POA: Diagnosis not present

## 2015-12-04 DIAGNOSIS — I809 Phlebitis and thrombophlebitis of unspecified site: Secondary | ICD-10-CM

## 2015-12-04 MED ORDER — NON FORMULARY: 5 refills | Status: AC

## 2015-12-04 NOTE — Patient Instructions (Addendum)
Take prescription [facility]  Address: [address], [place], [place] [address]  Phone: [phone]  Wear compression stockings daily  Take ibuprofen 400mg  q 4-6 hrs until swelling subsides Use warm compress to area 4-5x day for 20 minutes at a time and elevate legs while at home  Check bp in two weeks and then follow up for bp recheck  Limit salt intake and continue exercising  Return to clinic or seek medical care if symptoms worsen or you develop any other concerning symptoms we discussed     IF you received an x-ray today, you will receive an invoice from [facility]. Please contact [facility] at [phone] with questions or concerns regarding your invoice.   IF you received labwork today, you will receive an invoice from [employer]. Please contact [facility] at [phone] with questions or concerns regarding your invoice.   Our billing staff will not be able to assist you with questions regarding bills from these companies.  You will be contacted with the lab results as soon as they are available. The fastest way to get your results is to activate your My Chart account. Instructions are located on the last page of this paperwork. If you have not heard from [place] regarding the results in 2 weeks, please contact this office.

## 2015-12-04 NOTE — Progress Notes (Signed)
Antonio Wiggins  MRN: [id number] DOB: 03/15/1971  Subjective:  Antonio Wiggins is a 45 y.o. male seen in office today for a chief complaint of varicose veins x 3 years. Has noticed a hard spot on one of the veins one week ago and this concerned patient. Has associated dull ache pain and worsening pain at night when he lies down to put his feet up. Denies recent trauma, recent immobilization/surgery, hx of clotting/malignancy, smoking, and recent travel. Pt has tried compression stockings that his friend gave him in the past but never consistently or for a long period of time. Notes that they did provide relief when he wore them. 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   Of note, pt's bp is elevated in office. Notes that he has never been told he has had high bp. Would like to follow up for bp recheck.   Review of Systems  Constitutional: Negative for diaphoresis, fatigue and fever.  Respiratory: Negative for chest tightness and shortness of breath.   Cardiovascular: Negative for chest pain, palpitations and leg swelling.  Gastrointestinal: Negative for abdominal pain, nausea and vomiting.  Genitourinary: Negative for hematuria.  Neurological: Negative for dizziness, light-headedness and headaches.    There are no active problems to display for this patient.   Current Outpatient Prescriptions on File Prior to Visit  Medication Sig Dispense Refill  . atorvastatin (LIPITOR) 20 MG tablet Take 20 mg by mouth daily.    Marland Kitchen dextroamphetamine (DEXEDRINE SPANSULE) 15 MG 24 hr capsule Take 15 mg by mouth 2 (two) times daily.    Marland Kitchen HYDROcodone-acetaminophen (NORCO) 5-325 MG per tablet Take 1 tablet by mouth every 6 (six) hours as needed for moderate pain. (Patient not taking: Reported on 12/04/2015) 30 tablet 0   No current facility-administered medications on file prior to visit.     No Known Allergies  Objective:  BP (!) 144/88   Pulse 78   Temp 98.6 F (37 C) (Oral)    Resp 17   Ht 5\' 11"  (1.803 m)   Wt 199 lb (90.3 kg)   SpO2 99%   BMI 27.75 kg/m   Physical Exam  Constitutional: He is oriented to person, place, and time and well-developed, well-nourished, and in no distress.  HENT:  Head: Normocephalic and atraumatic.  Eyes: Conjunctivae are normal.  Neck: Normal range of motion.  Pulmonary/Chest: Effort normal.  Musculoskeletal:  Enlarged, dilated tortuous veins noted on lower legs bilaterally. Telangectasia noted.  2 cm of induration noted along varicose vein of posterior right calf. Minimal warmth and tenderness palpated directly over induration. No swelling or erythema noted.   Negative Homans sign on right leg.  Calf measurement is 39 cm bilaterally.   Neurological: He is alert and oriented to person, place, and time. Gait normal.  Skin: Skin is warm and dry.  Psychiatric: Affect normal.  Vitals reviewed.  BP Readings from Last 3 Encounters:  12/04/15 (!) 144/88  09/26/14 (!) 146/80  06/18/11 124/85   Assessment and Plan :  1. Varicose veins - Ambulatory referral to Vascular Surgery  2. Superficial thrombophlebitis - NON FORMULARY; Use compression stockings with 15-27mmHg for each leg up to the knee.  Dispense: 5 Units; Refill: 5 -ibuprofen 400mg  q 4-6 hrs until swelling subsides -Use warm compress to area 4-5x day for 20 minutes at a time and elevate legs while at home -Return to clinic if symptoms worsen, do not improve, or as needed  3.  Elevated blood pressure -Follow up in 2 weeks for bp recheck.  -Monitor bp outside of the office a few times over the next two weeks and bring those recordings to visit.    Tenna Delaine PA-C  [facility] 12/04/2015 5:35 PM

## 2019-08-27 ENCOUNTER — Other Ambulatory Visit: Payer: Self-pay

## 2019-08-27 ENCOUNTER — Encounter (HOSPITAL_COMMUNITY): Payer: Self-pay | Admitting: Emergency Medicine

## 2019-08-27 ENCOUNTER — Emergency Department (HOSPITAL_COMMUNITY)
Admission: EM | Admit: 2019-08-27 | Discharge: 2019-08-28 | Disposition: A | Discharge status: Home / Self Care | Payer: BLUE CROSS/BLUE SHIELD | Attending: Emergency Medicine | Admitting: Emergency Medicine

## 2019-08-27 DIAGNOSIS — Z5321 Procedure and treatment not carried out due to patient leaving prior to being seen by health care provider: Secondary | ICD-10-CM | POA: Insufficient documentation

## 2019-08-27 DIAGNOSIS — R339 Retention of urine, unspecified: Secondary | ICD-10-CM | POA: Insufficient documentation

## 2019-08-27 LAB — COMPREHENSIVE METABOLIC PANEL
ALT: 17 U/L (ref 0–44)
AST: 20 U/L (ref 15–41)
Albumin: 4.4 g/dL (ref 3.5–5.0)
Alkaline Phosphatase: 79 U/L (ref 38–126)
Anion gap: 10 (ref 5–15)
BUN: 17 mg/dL (ref 6–20)
CO2: 25 mmol/L (ref 22–32)
Calcium: 9.5 mg/dL (ref 8.9–10.3)
Chloride: 105 mmol/L (ref 98–111)
Creatinine, Ser: 1.27 mg/dL — ABNORMAL HIGH (ref 0.61–1.24)
GFR calc Af Amer: >60 mL/min (ref >60)
GFR calc non Af Amer: >60 mL/min (ref >60)
Glucose, Bld: 90 mg/dL (ref 70–99)
Potassium: 3.5 mmol/L (ref 3.5–5.1)
Sodium: 140 mmol/L (ref 135–145)
Total Bilirubin: 1.5 mg/dL — ABNORMAL HIGH (ref 0.3–1.2)
Total Protein: 7.3 g/dL (ref 6.5–8.1)

## 2019-08-27 LAB — URINALYSIS, ROUTINE W REFLEX MICROSCOPIC
Bilirubin Urine: NEGATIVE
Glucose, UA: NEGATIVE mg/dL
Hgb urine dipstick: NEGATIVE
Ketones, ur: 5 mg/dL — AB
Leukocytes,Ua: NEGATIVE
Nitrite: NEGATIVE
Protein, ur: NEGATIVE mg/dL
Specific Gravity, Urine: 1.024 (ref 1.005–1.030)
pH: 5 (ref 5.0–8.0)

## 2019-08-27 LAB — CBC WITH DIFFERENTIAL/PLATELET
Abs Immature Granulocytes: 0.03 10*3/uL (ref 0.00–0.07)
Basophils Absolute: 0.1 10*3/uL (ref 0.0–0.1)
Basophils Relative: 1 %
Eosinophils Absolute: 0.1 10*3/uL (ref 0.0–0.5)
Eosinophils Relative: 1 %
HCT: 43.4 % (ref 39.0–52.0)
Hemoglobin: 15 g/dL (ref 13.0–17.0)
Immature Granulocytes: 0 %
Lymphocytes Relative: 14 %
Lymphs Abs: 1.3 10*3/uL (ref 0.7–4.0)
MCH: 30.2 pg (ref 26.0–34.0)
MCHC: 34.6 g/dL (ref 30.0–36.0)
MCV: 87.5 fL (ref 80.0–100.0)
Monocytes Absolute: 0.5 10*3/uL (ref 0.1–1.0)
Monocytes Relative: 6 %
Neutro Abs: 7.4 10*3/uL (ref 1.7–7.7)
Neutrophils Relative %: 78 %
Platelets: 289 10*3/uL (ref 150–400)
RBC: 4.96 MIL/uL (ref 4.22–5.81)
RDW: 11.9 % (ref 11.5–15.5)
WBC: 9.4 10*3/uL (ref 4.0–10.5)
nRBC: 0 % (ref 0.0–0.2)

## 2019-08-27 NOTE — ED Triage Notes (Signed)
Patient reports difficulty urinating today , denies hematuria /no penile discharge , no fever or chills .

## 2019-08-28 NOTE — ED Notes (Signed)
Patient states "I'm not going to be able to stay another three hours i've been up since 4am Friday and im tired." advised patient to stay. Patient lwbs

## 2024-01-25 ENCOUNTER — Encounter: Payer: Self-pay | Admitting: Cardiology

## 2024-01-25 ENCOUNTER — Ambulatory Visit: Payer: Self-pay | Attending: Cardiology | Admitting: Cardiology

## 2024-01-25 VITALS — BP 150/84 | HR 58 | Ht 70.0 in | Wt 222.2 lb

## 2024-01-25 DIAGNOSIS — R030 Elevated blood-pressure reading, without diagnosis of hypertension: Secondary | ICD-10-CM | POA: Diagnosis not present

## 2024-01-25 DIAGNOSIS — E782 Mixed hyperlipidemia: Secondary | ICD-10-CM | POA: Diagnosis not present

## 2024-01-25 MED ORDER — ATORVASTATIN CALCIUM 80 MG PO TABS: 80.0000 mg | ORAL_TABLET | Freq: Every day | ORAL | 3 refills | Status: AC

## 2024-01-25 NOTE — Progress Notes (Signed)
 Cardiology Office Note:  .   Date:  01/25/2024  ID:  Antonio Wiggins. Antonio Wiggins, Antonio Wiggins [birth year], MRN [id number] PCP: Valma Carwin, MD  [facility] Providers Cardiologist:  Newman Lawrence, MD PCP: Valma Carwin, MD  Chief Complaint  Patient presents with   Hyperlipidemia     Antonio Wiggins. Antonio Wiggins is a 53 y.o. male with hypertension, mixed hyperlipidemia, hypertriglyceridemia  Discussed the use of AI scribe software for clinical note transcription with the patient, who gave verbal consent to proceed.  History of Present Illness Antonio Wiggins is a 53 year old male who presents for evaluation of high cholesterol and triglycerides. He was referred by Dr. Morera for evaluation of high cholesterol and triglycerides.  He has elevated cholesterol and triglyceride levels, previously identified during a workup. He takes atorvastatin 20 mg since 2023. His family history includes heart issues in his mother and grandmother, and his mother also has high cholesterol.  He experiences no chest pain, shortness of breath, or headaches. He does not monitor blood pressure at home, but it tends to be high during visits.  His diet includes processed foods, and he tries to avoid fast food. He cooks chicken or steak with vegetables and limits white rice and bread. He does not smoke cigarettes but occasionally smokes cigars.  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      Vitals:   01/25/24 1509  BP: (!) 150/84  Pulse: (!) 58  SpO2: 96%      Review of Systems  Cardiovascular:  Negative for chest pain, dyspnea on exertion, leg swelling, palpitations and syncope.        Studies Reviewed: SABRA        EKG 01/25/2024: Sinus rhythm 59 bpm Left axis deviation Septal infarct , age undetermined No previous ECGs available  Labs 10/2023: Chol 266, TG 488, HDL 47, LDL 157 HbA1C 5.4% Hb 15.5 Cr 0.96 TSH 3.9   Physical Exam Vitals and nursing note  reviewed.  Constitutional:      General: He is not in acute distress. Neck:     Vascular: No JVD.  Cardiovascular:     Rate and Rhythm: Normal rate and regular rhythm.     Heart sounds: Normal heart sounds. No murmur heard. Pulmonary:     Effort: Pulmonary effort is normal.     Breath sounds: Normal breath sounds. No wheezing or rales.  Musculoskeletal:     Right lower leg: No edema.     Left lower leg: No edema.      VISIT DIAGNOSES:   ICD-10-CM   1. Mixed hyperlipidemia  E78.2 EKG 12-Lead    Lipid panel    CT CARDIAC SCORING (SELF PAY ONLY)    2. Elevated blood pressure reading without diagnosis of hypertension  R03.0 EKG 12-Lead       Antonio Hackenberg. Wiggins is a 53 y.o. male with hypertension, mixed hyperlipidemia, hypertriglyceridemia Assessment & Plan Mixed hyperlipidemia: Mixed hyperlipidemia with elevated cholesterol and triglycerides, family history of possible hypercholesterolemia. Current atorvastatin dose insufficient, increasing cardiovascular risk. - Increase atorvastatin to highest dose 80 mg daily.. - Recommend heart-healthy Mediterranean diet, low simple carbohydrates, low saturated fat. - Order repeat fasting lipid panel in three months. - Recommend calcium score scan for coronary artery calcium assessment.  Elevated blood pressure without diagnosis of hypertension: Elevated blood pressure observed, possible white coat hypertension. No home monitoring, lifestyle changes needed. - Encourage reduction of dietary salt intake, avoid eating out and fast food. -  Recommend increasing physical activity to 10,000 steps daily. - Advise obtaining home blood pressure monitor for regular monitoring. - Reassess blood pressure in three months.  Meds ordered this encounter  Medications   atorvastatin (LIPITOR) 80 MG tablet    Sig: Take 1 tablet (80 mg total) by mouth daily.    Dispense:  90 tablet    Refill:  3     F/u in 3 months  Signed, Newman JINNY Lawrence,  MD

## 2024-01-25 NOTE — Patient Instructions (Addendum)
 Medication Instructions:  INCREASE Lipitor to 80 mg daily   *If you need a refill on your cardiac medications before your next appointment, please call your pharmacy*  Lab Work: FASTING LIPID PANEL   If you have labs (blood work) drawn today and your tests are completely normal, you will receive your results only by: MyChart Message (if you have MyChart) OR A paper copy in the mail If you have any lab test that is abnormal or we need to change your treatment, we will call you to review the results.  Testing/Procedures: CT CARDIAC SCORING   CT scanning for a cardiac calcium score (CAT scanning), is a noninvasive, special x-ray that produces cross-sectional images of the body using x-rays and a computer. CT scans help physicians diagnose and treat medical conditions. For some CT exams, a contrast material is used to enhance visibility in the area of the body being studied. CT scans provide greater clarity and reveal more details than regular x-ray exams.   Follow-Up: At [facility], you and your health needs are our priority.  As part of our continuing mission to provide you with exceptional heart care, our providers are all part of one team.  This team includes your primary Cardiologist (physician) and Advanced Practice Providers or APPs (Physician Assistants and Nurse Practitioners) who all work together to provide you with the care you need, when you need it.  Your next appointment:   3 month(s)  Provider:   Newman JINNY Lawrence, MD    Diet & Lifestyle recommendations:  Physical activity recommendation (The Physical Activity Guidelines for Americans. JAMA 2018;Nov 12) At least 150-300 minutes a week of moderate-intensity, or 75-150 minutes a week of vigorous-intensity aerobic physical activity, or an equivalent combination of moderate- and vigorous-intensity aerobic activity. Adults should perform muscle-strengthening activities on 2 or more days a week. Older adults should do  multicomponent physical activity that includes balance training as well as aerobic and muscle-strengthening activities. Benefits of increased physical activity include lower risk of mortality including cardiovascular mortality, lower risk of cardiovascular events and associated risk factors (hypertension and diabetes), and lower risk of many cancers (including bladder, breast, colon, endometrium, esophagus, kidney, lung, and stomach). Additional improvments have been seen in cognition, risk of dementia, anxiety and depression, improved bone health, lower risk of falls, and associated injuries.  Dietary recommendation The 2019 ACC/AHA guidelines promote nutrition as a main fixture of cardiovascular wellness, with a recommendation for a varied diet of fruit, vegetables, fish, legumes, and whole grains (Class I), as well as recommendations to reduce sodium, cholesterol, processed meats, and refined sugars (Class IIa recommendation).10 Sodium intake, a topic of some controversy as of late, is recommended to be kept at 1,500 mg/day or less, far below the average daily intake in the [place]  of 3,409 mg/day, and notably below that of previous [place]  recommendations for 300mg /day.10,11 For those unable to reach 1,500 mg/day, they recommend at least a reduction of 1000 mg/day.  A Pesco-Mediterranean Diet With Intermittent Fasting: JACC Review Topic of the Week. J Am Coll Cardiol 2020;76:1484-1493 Pesco-Mediterranean diet, it is supplemented with extra-virgin olive oil (EVOO), which is the principle fat source, along with moderate amounts of dairy (particularly yogurt and cheese) and eggs, as well as modest amounts of alcohol consumption (ideally red wine with the evening meal), but few red and processed meats.

## 2024-01-28 ENCOUNTER — Ambulatory Visit (HOSPITAL_COMMUNITY): Admission: RE | Admit: 2024-01-28 | Payer: Self-pay | Source: Ambulatory Visit

## 2024-01-29 ENCOUNTER — Ambulatory Visit: Payer: Self-pay | Admitting: Cardiology

## 2024-01-29 DIAGNOSIS — E782 Mixed hyperlipidemia: Secondary | ICD-10-CM

## 2024-01-29 LAB — LIPID PANEL
Chol/HDL Ratio: 8.3 ratio — ABNORMAL HIGH (ref 0.0–5.0)
Cholesterol, Total: 283 mg/dL — ABNORMAL HIGH (ref 100–199)
HDL: 34 mg/dL — ABNORMAL LOW (ref >39)
LDL Chol Calc (NIH): 144 mg/dL — ABNORMAL HIGH (ref 0–99)
Triglycerides: 552 mg/dL (ref 0–149)
VLDL Cholesterol Cal: 105 mg/dL — ABNORMAL HIGH (ref 5–40)

## 2024-01-29 NOTE — Progress Notes (Signed)
 Triglyceride and cholesterol remain elevated.  However, statin dose was increased on a few days ago.  Continue current dose.  Recommend repeating fasting lipid panel in 04/2024.  Thanks MJP

## 2024-04-22 ENCOUNTER — Ambulatory Visit: Admitting: Cardiology

## 2024-04-22 NOTE — Progress Notes (Deleted)
 " Cardiology Office Note:  .   Date:  04/22/2024  ID:  Antonio Wiggins, Antonio Wiggins 16-Jul-1970, MRN [id number] PCP: Valma Carwin, MD  [facility] Providers Cardiologist:  Newman Lawrence, MD PCP: Valma Carwin, MD  No chief complaint on file.    Antonio RAMAN. Kincaid is a 53 y.o. male with hypertension, mixed hyperlipidemia, hypertriglyceridemia  Discussed the use of AI scribe software for clinical note transcription with the patient, who gave verbal consent to proceed.  History of Present Illness Antonio Wiggins is a 53 year old male who presents for evaluation of high cholesterol and triglycerides. He was referred by Dr. Morera for evaluation of high cholesterol and triglycerides.  He has elevated cholesterol and triglyceride levels, previously identified during a workup. He takes atorvastatin  20 mg since 2023. His family history includes heart issues in his mother and grandmother, and his mother also has high cholesterol.  He experiences no chest pain, shortness of breath, or headaches. He does not monitor blood pressure at home, but it tends to be high during visits.  His diet includes processed foods, and he tries to avoid fast food. He cooks chicken or steak with vegetables and limits white rice and bread. He does not smoke cigarettes but occasionally smokes cigars.  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      There were no vitals filed for this visit.     Review of Systems  Cardiovascular:  Negative for chest pain, dyspnea on exertion, leg swelling, palpitations and syncope.        Studies Reviewed: SABRA        EKG 01/25/2024: Sinus rhythm 59 bpm Left axis deviation Septal infarct , age undetermined No previous ECGs available  :abs 01/2024: Chol 283, TG 552, HDL 34, LDL 144 HbA1C ***% Hb *** Cr *** ***   Labs 10/2023: Chol 266, TG 488, HDL 47, LDL 157 HbA1C 5.4% Hb 15.5 Cr 0.96 TSH 3.9   Physical  Exam Vitals and nursing note reviewed.  Constitutional:      General: He is not in acute distress. Neck:     Vascular: No JVD.  Cardiovascular:     Rate and Rhythm: Normal rate and regular rhythm.     Heart sounds: Normal heart sounds. No murmur heard. Pulmonary:     Effort: Pulmonary effort is normal.     Breath sounds: Normal breath sounds. No wheezing or rales.  Musculoskeletal:     Right lower leg: No edema.     Left lower leg: No edema.      VISIT DIAGNOSES: No diagnosis found.    Antonio RAMAN. Coll is a 53 y.o. male with hypertension, mixed hyperlipidemia, hypertriglyceridemia Assessment & Plan Mixed hyperlipidemia: Mixed hyperlipidemia with elevated cholesterol and triglycerides, family history of possible hypercholesterolemia. Current atorvastatin  dose insufficient, increasing cardiovascular risk. - Increase atorvastatin  to highest dose 80 mg daily.. - Recommend heart-healthy Mediterranean diet, low simple carbohydrates, low saturated fat. - Order repeat fasting lipid panel in three months. - Recommend calcium  score scan for coronary artery calcium  assessment.  Elevated blood pressure without diagnosis of hypertension: Elevated blood pressure observed, possible white coat hypertension. No home monitoring, lifestyle changes needed. - Encourage reduction of dietary salt intake, avoid eating out and fast food. - Recommend increasing physical activity to 10,000 steps daily. - Advise obtaining home blood pressure monitor for regular monitoring. - Reassess blood pressure in three months.  No orders of the defined types were placed in  this encounter.    F/u in 3 months  Signed, Newman JINNY Lawrence, MD  "

## 2024-05-20 ENCOUNTER — Other Ambulatory Visit: Payer: Self-pay | Admitting: Internal Medicine

## 2024-05-20 DIAGNOSIS — E785 Hyperlipidemia, unspecified: Secondary | ICD-10-CM

## 2024-06-01 ENCOUNTER — Other Ambulatory Visit

## 2024-06-09 ENCOUNTER — Other Ambulatory Visit

## 2024-06-13 ENCOUNTER — Ambulatory Visit: Admitting: Cardiology
